# Patient Record
Sex: Male | Born: 1980
Health system: Southern US, Community
[De-identification: ages and names within clinical notes are randomized; demographics above are authoritative.]

## PROBLEM LIST (undated history)

## (undated) DIAGNOSIS — I10 Essential (primary) hypertension: Secondary | ICD-10-CM

---

## 1999-09-25 ENCOUNTER — Emergency Department (HOSPITAL_COMMUNITY): Admission: EM | Admit: 1999-09-25 | Discharge: 1999-09-25 | Payer: Self-pay | Admitting: Emergency Medicine

## 2001-09-16 ENCOUNTER — Encounter: Payer: Self-pay | Admitting: Emergency Medicine

## 2001-09-16 ENCOUNTER — Emergency Department (HOSPITAL_COMMUNITY): Admission: EM | Admit: 2001-09-16 | Discharge: 2001-09-16 | Payer: Self-pay | Admitting: Emergency Medicine

## 2001-12-07 ENCOUNTER — Emergency Department (HOSPITAL_COMMUNITY): Admission: EM | Admit: 2001-12-07 | Discharge: 2001-12-07 | Payer: Self-pay | Admitting: Emergency Medicine

## 2001-12-07 ENCOUNTER — Encounter: Payer: Self-pay | Admitting: Emergency Medicine

## 2002-12-23 ENCOUNTER — Emergency Department (HOSPITAL_COMMUNITY): Admission: EM | Admit: 2002-12-23 | Discharge: 2002-12-23 | Payer: Self-pay | Admitting: Emergency Medicine

## 2002-12-25 ENCOUNTER — Emergency Department (HOSPITAL_COMMUNITY): Admission: EM | Admit: 2002-12-25 | Discharge: 2002-12-25 | Payer: Self-pay | Admitting: *Deleted

## 2008-07-01 ENCOUNTER — Emergency Department (HOSPITAL_COMMUNITY): Admission: EM | Admit: 2008-07-01 | Discharge: 2008-07-01 | Payer: Self-pay | Admitting: Emergency Medicine

## 2008-07-03 ENCOUNTER — Emergency Department (HOSPITAL_COMMUNITY): Admission: EM | Admit: 2008-07-03 | Discharge: 2008-07-03 | Payer: Self-pay | Admitting: Family Medicine

## 2008-07-07 ENCOUNTER — Emergency Department (HOSPITAL_COMMUNITY): Admission: EM | Admit: 2008-07-07 | Discharge: 2008-07-07 | Payer: Self-pay | Admitting: Family Medicine

## 2011-07-23 LAB — CBC
HCT: 48.3
Hemoglobin: 15.5
MCHC: 32
MCV: 85.3
Platelets: 197
RBC: 5.66
RDW: 12.3
WBC: 8.7

## 2011-07-23 LAB — DIFFERENTIAL
Basophils Absolute: 0
Basophils Relative: 0
Neutro Abs: 7.2
Neutrophils Relative %: 82 — ABNORMAL HIGH

## 2017-12-22 ENCOUNTER — Emergency Department (HOSPITAL_COMMUNITY): Payer: 59

## 2017-12-22 ENCOUNTER — Emergency Department (HOSPITAL_COMMUNITY)
Admission: EM | Admit: 2017-12-22 | Discharge: 2017-12-22 | Disposition: A | Discharge status: Home / Self Care | Payer: 59 | Attending: Physician Assistant | Admitting: Physician Assistant

## 2017-12-22 ENCOUNTER — Encounter (HOSPITAL_COMMUNITY): Payer: Self-pay

## 2017-12-22 DIAGNOSIS — Y990 Civilian activity done for income or pay: Secondary | ICD-10-CM | POA: Insufficient documentation

## 2017-12-22 DIAGNOSIS — Y9289 Other specified places as the place of occurrence of the external cause: Secondary | ICD-10-CM | POA: Insufficient documentation

## 2017-12-22 DIAGNOSIS — T59811A Toxic effect of smoke, accidental (unintentional), initial encounter: Secondary | ICD-10-CM

## 2017-12-22 DIAGNOSIS — J705 Respiratory conditions due to smoke inhalation: Secondary | ICD-10-CM | POA: Insufficient documentation

## 2017-12-22 DIAGNOSIS — Y939 Activity, unspecified: Secondary | ICD-10-CM | POA: Insufficient documentation

## 2017-12-22 DIAGNOSIS — Z87891 Personal history of nicotine dependence: Secondary | ICD-10-CM | POA: Insufficient documentation

## 2017-12-22 DIAGNOSIS — X011XXA Exposure to smoke in uncontrolled fire, not in building or structure, initial encounter: Secondary | ICD-10-CM | POA: Insufficient documentation

## 2017-12-22 DIAGNOSIS — Z79899 Other long term (current) drug therapy: Secondary | ICD-10-CM | POA: Insufficient documentation

## 2017-12-22 MED ORDER — IBUPROFEN 800 MG PO TABS: 800.0000 mg | ORAL_TABLET | Freq: Three times a day (TID) | ORAL | 0 refills | Status: AC

## 2017-12-22 MED ORDER — CYCLOBENZAPRINE HCL 10 MG PO TABS: 10.0000 mg | ORAL_TABLET | Freq: Two times a day (BID) | ORAL | 0 refills | Status: AC | PRN
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
## 2017-12-22 NOTE — ED Notes (Signed)
Pt stable and ambulatory for discharge, states understanding follow up.  

## 2017-12-22 NOTE — Discharge Instructions (Signed)
Please follow-up with your primary care provider.  Please return if you are having increasing cough or shortness of breath.  Please use ibuprofen and Flexeril to help with your muscle strains.

## 2019-05-23 ENCOUNTER — Other Ambulatory Visit: Payer: Self-pay

## 2019-05-23 ENCOUNTER — Emergency Department: Payer: 59

## 2019-05-23 ENCOUNTER — Emergency Department
Admission: EM | Admit: 2019-05-23 | Discharge: 2019-05-23 | Disposition: A | Discharge status: Home / Self Care | Payer: 59 | Attending: Emergency Medicine | Admitting: Emergency Medicine

## 2019-05-23 DIAGNOSIS — I10 Essential (primary) hypertension: Secondary | ICD-10-CM | POA: Insufficient documentation

## 2019-05-23 DIAGNOSIS — R0789 Other chest pain: Secondary | ICD-10-CM | POA: Diagnosis not present

## 2019-05-23 DIAGNOSIS — Z20828 Contact with and (suspected) exposure to other viral communicable diseases: Secondary | ICD-10-CM | POA: Diagnosis not present

## 2019-05-23 DIAGNOSIS — Z79899 Other long term (current) drug therapy: Secondary | ICD-10-CM | POA: Diagnosis not present

## 2019-05-23 DIAGNOSIS — R0602 Shortness of breath: Secondary | ICD-10-CM | POA: Diagnosis present

## 2019-05-23 HISTORY — DX: Essential (primary) hypertension: I10

## 2019-05-23 LAB — CBC WITH DIFFERENTIAL/PLATELET
Abs Immature Granulocytes: 0.01 10*3/uL (ref 0.00–0.07)
Basophils Absolute: 0 10*3/uL (ref 0.0–0.1)
Basophils Relative: 1 %
Eosinophils Absolute: 0.1 10*3/uL (ref 0.0–0.5)
Eosinophils Relative: 3 %
HCT: 51.8 % (ref 39.0–52.0)
Hemoglobin: 16.7 g/dL (ref 13.0–17.0)
Immature Granulocytes: 0 %
Lymphocytes Relative: 36 %
Lymphs Abs: 1.6 10*3/uL (ref 0.7–4.0)
MCH: 27.6 pg (ref 26.0–34.0)
MCHC: 32.2 g/dL (ref 30.0–36.0)
MCV: 85.8 fL (ref 80.0–100.0)
Monocytes Absolute: 0.4 10*3/uL (ref 0.1–1.0)
Monocytes Relative: 10 %
Neutro Abs: 2.2 10*3/uL (ref 1.7–7.7)
Neutrophils Relative %: 50 %
Platelets: 247 10*3/uL (ref 150–400)
RBC: 6.04 MIL/uL — ABNORMAL HIGH (ref 4.22–5.81)
RDW: 12.7 % (ref 11.5–15.5)
WBC: 4.4 10*3/uL (ref 4.0–10.5)
nRBC: 0 % (ref 0.0–0.2)

## 2019-05-23 LAB — BASIC METABOLIC PANEL
Anion gap: 12 (ref 5–15)
BUN: 17 mg/dL (ref 6–20)
CO2: 25 mmol/L (ref 22–32)
Calcium: 9.7 mg/dL (ref 8.9–10.3)
Chloride: 102 mmol/L (ref 98–111)
Creatinine, Ser: 1.17 mg/dL (ref 0.61–1.24)
GFR calc Af Amer: >60 mL/min (ref >60)
GFR calc non Af Amer: >60 mL/min (ref >60)
Glucose, Bld: 100 mg/dL — ABNORMAL HIGH (ref 70–99)
Potassium: 4.1 mmol/L (ref 3.5–5.1)
Sodium: 139 mmol/L (ref 135–145)

## 2019-05-23 LAB — TROPONIN I (HIGH SENSITIVITY)
Troponin I (High Sensitivity): 5 ng/L (ref <18)
Troponin I (High Sensitivity): 4 ng/L (ref <18)

## 2019-05-23 MED ORDER — ACETAMINOPHEN 500 MG PO TABS
1000.0000 mg | ORAL_TABLET | Freq: Once | ORAL | Status: AC
  Administered 2019-05-23: 13:00:00 1000 mg via ORAL
  Filled 2019-05-23: qty 2

## 2019-05-23 NOTE — ED Notes (Signed)
Dr. Charna Archer informed of pt's c/o headache 7/10, verbal given for tylenol 1 g

## 2019-05-23 NOTE — ED Triage Notes (Signed)
Pt arrives via ems from work. Ems reports diaphoretic, SOB, hx htn, some chest tightness. On arrival NAD noted. Pt a&o x 4.   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

## 2019-05-23 NOTE — ED Provider Notes (Signed)
[facility] Emergency Department Provider Note   ____________________________________________   First MD Initiated Contact with Patient 05/23/19 1057     (approximate)  I have reviewed the triage vital signs and the nursing notes.   HISTORY  Chief Complaint Shortness of Breath    HPI James Rojas is a 38 y.o. male with history of hypertension presents to the ED complaining of shortness of breath.  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  This was associated with some tightness and sharp pain in his chest.  All symptoms have resolved following rest.  He states he was feeling well when he woke up this morning with no fevers, chills, or cough.  He has not had any recent surgeries, chemotherapy, or long travel.  He has a non-smoker with no significant family history of heart disease at young age.        Past Medical History:  Diagnosis Date  . Hypertension     There are no active problems to display for this patient.   History reviewed. No pertinent surgical history.  Prior to Admission medications   Medication Sig Start Date End Date Taking? Authorizing Provider  buPROPion (WELLBUTRIN SR) 150 MG 12 hr tablet Take 150 mg by mouth 2 (two) times daily. 10/05/15 08/17/18  [provider]  cyclobenzaprine (FLEXERIL) 10 MG tablet Take 1 tablet (10 mg total) by mouth 2 (two) times daily as needed for muscle spasms. 12/22/17   Mackuen, Courteney Lyn, MD  ibuprofen (ADVIL,MOTRIN) 800 MG tablet Take 1 tablet (800 mg total) by mouth 3 (three) times daily. 12/22/17   Mackuen, Fredia Sorrow, MD    Allergies Patient has no known allergies.  History reviewed. No pertinent family history.  Social History Social History   Tobacco Use  . Smoking status: Former Research scientist (life sciences)  . Smokeless tobacco: Never Used  Substance Use Topics  . Alcohol use: Not  Currently  . Drug use: Never    Review of Systems Constitutional: No fever/chills Eyes: No visual changes. ENT: No sore throat. Cardiovascular: Positive for chest pain. Respiratory: shortness of breath.  Gastrointestinal: No abdominal pain.  No nausea, no vomiting.  No diarrhea.  No constipation. Genitourinary: Negative for dysuria. Musculoskeletal: Negative for back pain. Skin: Negative for rash. Neurological: Negative for headaches, focal weakness or numbness.  ____________________________________________   PHYSICAL EXAM:  VITAL SIGNS: ED Triage Vitals  Enc Vitals Group     BP      Pulse      Resp      Temp      Temp src      SpO2      Weight      Height      Head Circumference      Peak Flow      Pain Score      Pain Loc      Pain Edu?      Excl. in [place]?    Constitutional: Alert and oriented. Eyes: Conjunctivae are normal. Head: Atraumatic. Nose: No congestion/rhinnorhea. Mouth/Throat: Mucous membranes are moist. Neck: Normal ROM Cardiovascular: Normal rate, regular rhythm. Grossly normal heart sounds. Respiratory: Normal respiratory effort.  No retractions. Lungs CTAB. Gastrointestinal: Soft and nontender. No distention. Genitourinary: deferred Musculoskeletal: No lower extremity tenderness nor edema. Neurologic:  Normal speech and language. No gross focal neurologic deficits are appreciated. Skin:  Skin is warm, dry  and intact. No rash noted. Psychiatric: Mood and affect are normal. Speech and behavior are normal.  ____________________________________________   LABS (all labs ordered are listed, but only abnormal results are displayed)  Labs Reviewed  CBC WITH DIFFERENTIAL/PLATELET - Abnormal; Notable for the following components:      Result Value   RBC 6.04 (*)    All other components within normal limits  BASIC METABOLIC PANEL - Abnormal; Notable for the following components:   Glucose, Bld 100 (*)    All other components within normal limits   NOVEL CORONAVIRUS, NAA (HOSPITAL ORDER, SEND-OUT TO REF LAB)  TROPONIN I (HIGH SENSITIVITY)  TROPONIN I (HIGH SENSITIVITY)   ____________________________________________  EKG  Sinus rhythm without acute ischemic changes. ____________________________________________  RADIOLOGY  Chest x-ray negative for acute process.   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  Symptoms now resolved upon arrival to the ED initial EKG without acute ischemic changes.  Chest x-ray negative for acute process.  Do not suspect PE as patient is PERC negative.  Do not suspect dissection.  Patient is low risk for ACS given heart score of less than 4, initial troponin within normal limits.  Second set troponin also within normal limits 2 hours following initial.  Patient appropriate for discharge home, counseled to follow-up with PCP and return to the ED for new or worsening symptoms.      ____________________________________________   FINAL CLINICAL IMPRESSION(S) / ED DIAGNOSES  Final diagnoses:  SOB (shortness of breath)  Chest tightness     ED Discharge Orders    None       Note:  This document was prepared using Dragon voice recognition software and may include unintentional dictation errors.   Chesley NoonJessup, Tarica Harl, MD 05/23/19 [phone]

## 2019-05-23 NOTE — ED Notes (Signed)
Pt reports one of his coworkers tested positive for covid, he is unsure when he was exposed, reports no cough or fever, reports wife is also around covid patients at work

## 2019-05-24 LAB — NOVEL CORONAVIRUS, NAA (HOSP ORDER, SEND-OUT TO REF LAB; TAT 18-24 HRS): SARS-CoV-2, NAA: NOT DETECTED

## 2020-01-07 ENCOUNTER — Ambulatory Visit: Payer: Self-pay | Attending: Internal Medicine

## 2020-01-07 DIAGNOSIS — Z23 Encounter for immunization: Secondary | ICD-10-CM

## 2020-01-07 NOTE — Progress Notes (Signed)
   Covid-19 Vaccination Clinic  Name:  James Rojas    MRN: [id number] DOB: September 20, 1981  01/07/2020  James Rojas was observed post Covid-19 immunization for 15 minutes without incident. He was provided with Vaccine Information Sheet and instruction to access the V-Safe system.   James Rojas was instructed to call 911 with any severe reactions post vaccine: Marland Kitchen Difficulty breathing  . Swelling of face and throat  . A fast heartbeat  . A bad rash all over body  . Dizziness and weakness   Immunizations Administered    Name Date Dose VIS Date Route   Pfizer COVID-19 Vaccine 01/07/2020 11:26 AM 0.3 mL 09/30/2019 Intramuscular   Manufacturer: [employer], [employer]   Lot: [id number]   NDC: [address]-1

## 2020-01-28 ENCOUNTER — Ambulatory Visit: Payer: Self-pay | Attending: Internal Medicine

## 2020-01-28 DIAGNOSIS — Z23 Encounter for immunization: Secondary | ICD-10-CM

## 2020-01-28 NOTE — Progress Notes (Signed)
   Covid-19 Vaccination Clinic  Name:  James Rojas    MRN: [id number] DOB: 08-Aug-1981  01/28/2020  [name] was observed post Covid-19 immunization for 15 minutes   without incident. He was provided with Vaccine Information Sheet and instruction to access the V-Safe system.   [name] was instructed to call 911 with any severe reactions post vaccine: Marland Kitchen Difficulty breathing  . Swelling of face and throat  . A fast heartbeat  . A bad rash all over body  . Dizziness and weakness   Immunizations Administered    Name Date Dose VIS Date Route   Pfizer COVID-19 Vaccine 01/28/2020 11:46 AM 0.3 mL 09/30/2019 Intramuscular   Manufacturer: [employer], [employer]   Lot: [id number]   NDC: [address]-4

## 2020-12-06 IMAGING — DX PORTABLE CHEST - 1 VIEW
2 series · 2 of 2 positions shown · non-contrast
Comparison: Portable exam at 2224 hrs compared to 12/22/2017

CLINICAL DATA: Chest pain and tightness, diaphoresis, shortness of
breath, history hypertension

EXAM:
PORTABLE CHEST 1 VIEW

[chest ap (1 of 2)]
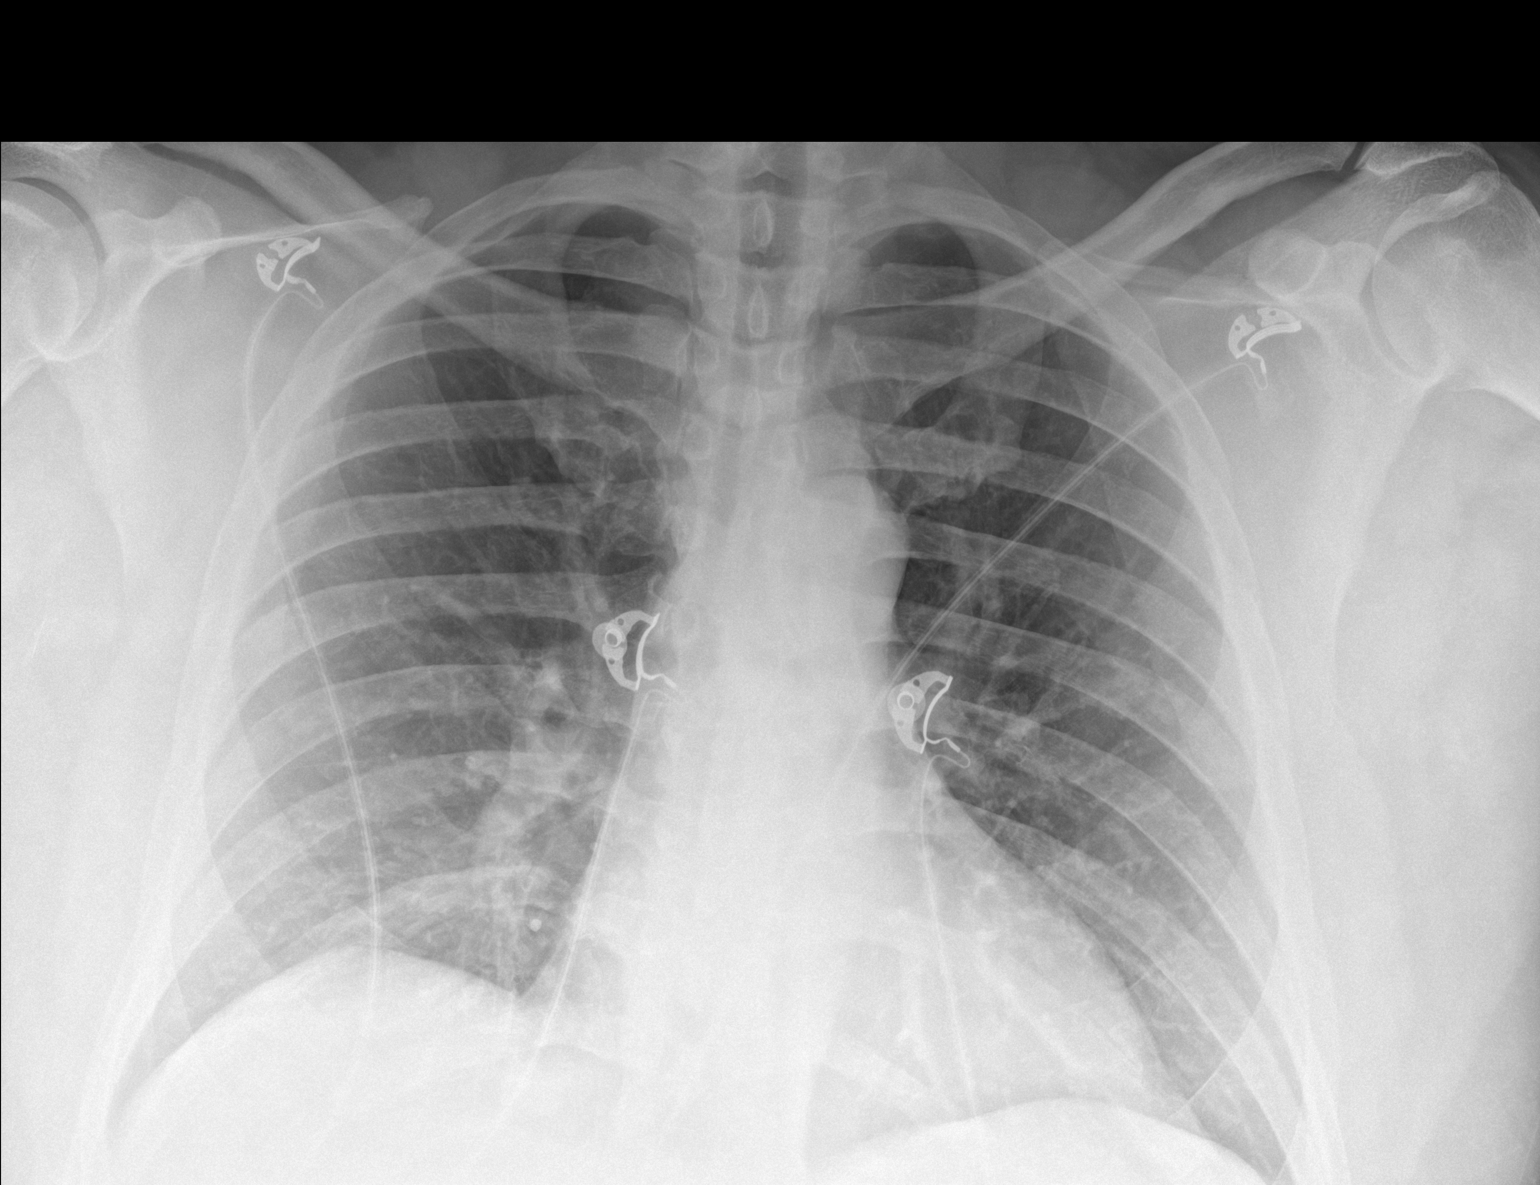

[chest ap (2 of 2)]
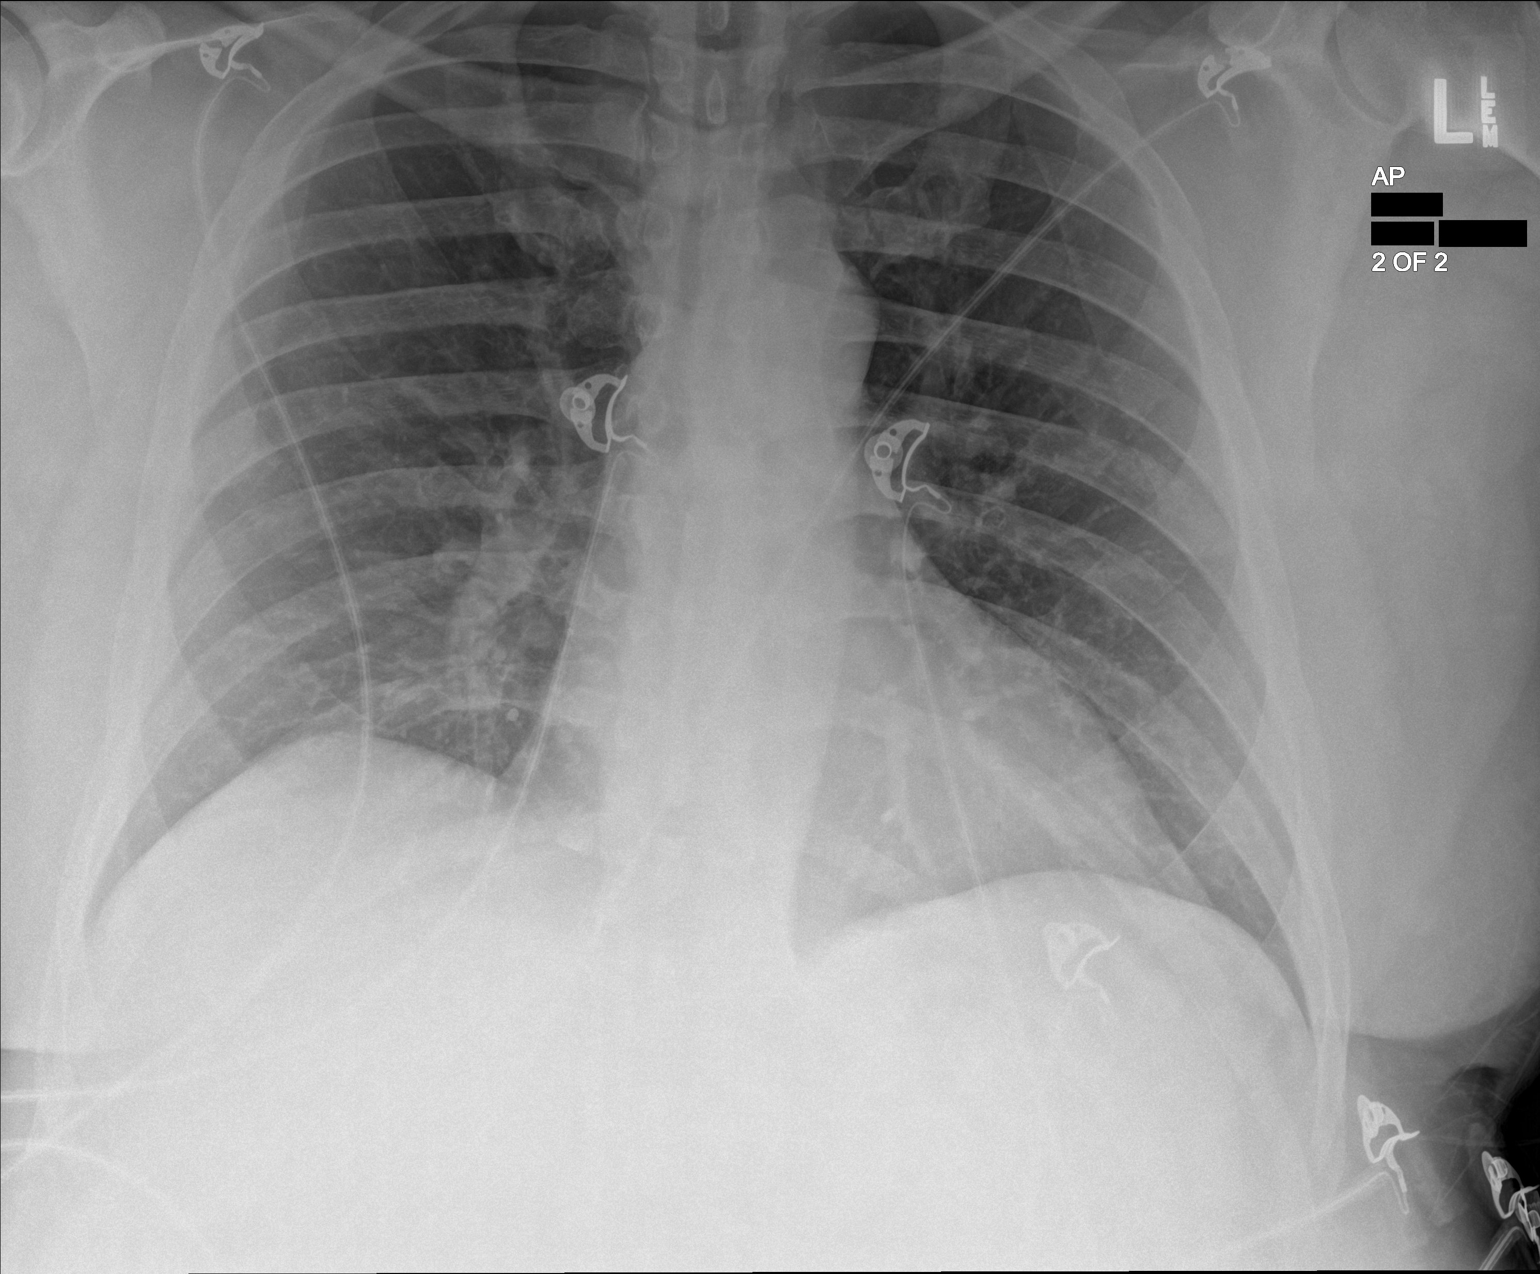

[2 of 2 positions shown; findings below may reference images not displayed]

FINDINGS: Normal heart size, mediastinal contours, and pulmonary vascularity.

Minimal atelectasis at RIGHT base.

Lungs otherwise clear.

No infiltrate, pleural effusion or pneumothorax.

Bones unremarkable.
IMPRESSION: Minimal RIGHT basilar atelectasis.
